# Patient Record
Sex: Female | Born: 2000 | Race: Black or African American | Hispanic: No | Marital: Single | Smoking: Never smoker
Health system: Southern US, Community
[De-identification: ages and names within clinical notes are randomized; demographics above are authoritative.]

## PROBLEM LIST (undated history)

## (undated) DIAGNOSIS — J45909 Unspecified asthma, uncomplicated: Secondary | ICD-10-CM

---
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
## 2022-02-13 ENCOUNTER — Ambulatory Visit
Admission: RE | Admit: 2022-02-13 | Discharge: 2022-02-13 | Disposition: A | Discharge status: Home / Self Care | Payer: Medicaid Other | Source: Ambulatory Visit | Attending: Physician Assistant | Admitting: Physician Assistant

## 2022-02-13 VITALS — BP 113/73 | HR 95 | Temp 98.7°F | Resp 16

## 2022-02-13 DIAGNOSIS — N760 Acute vaginitis: Secondary | ICD-10-CM | POA: Diagnosis not present

## 2022-02-13 DIAGNOSIS — Z113 Encounter for screening for infections with a predominantly sexual mode of transmission: Secondary | ICD-10-CM | POA: Diagnosis not present

## 2022-02-13 NOTE — ED Triage Notes (Signed)
Pt c/o vaginal irritation onset ~ 1 week ago. Requesting sti screening.

## 2022-02-13 NOTE — Discharge Instructions (Addendum)
Testing will be completed in 48 hours.  If you do not get a call from this office that indicates the test is negative.  Log onto MyChart to review the results when it post in 48 hours.  Advised follow-up PCP or return to urgent care as needed.

## 2022-02-13 NOTE — ED Provider Notes (Signed)
[facility]    CSN: [id number] Arrival date & time: 02/13/22  1154      History   Chief Complaint Chief Complaint  Patient presents with   SEXUALLY TRANSMITTED DISEASE    Entered by patient    HPI Charlotte Acosta is a 21 y.o. female.   21 year old female presents for STI screening.  Patient indicates the last time she had intercourse was 2 months ago and it was unprotected.  Patient indicates her last menses was 1 week ago and it was normal.  She indicates that for the past several days she has been having some external vaginal irritation.  She denies any vaginal discharge or odor.  She denies any urinary symptoms such as frequency urgency or dysuria.  Patient today is requesting to have STI screening.  She denies any back pain fever or chills.     Past Medical History:  Diagnosis Date   Asthma     There are no problems to display for this patient.   History reviewed. No pertinent surgical history.  OB History   No obstetric history on file.      Home Medications    Prior to Admission medications   Medication Sig Start Date End Date Taking? Authorizing Provider  etonogestrel (NEXPLANON) 68 MG IMPL implant  05/11/20  Yes [provider]  albuterol (PROVENTIL HFA;VENTOLIN HFA) 108 (90 BASE) MCG/ACT inhaler Inhale 2 puffs into the lungs every 6 (six) hours as needed. For wheezing/shortness of breath    [provider]  amoxicillin (AMOXIL) 400 MG/5ML suspension 10 mls po bid x 10 days 12/26/11   Viviano Simas, NP  loratadine (CLARITIN) 5 MG chewable tablet Chew 5 mg by mouth daily.    [provider]  metroNIDAZOLE (FLAGYL) 500 MG tablet Take 1 tablet (500 mg total) by mouth 2 (two) times daily. 09/19/21   Lamptey, Britta Mccreedy, MD    Family History Family History  Problem Relation Age of Onset   Healthy Mother    Healthy Father     Social History Social History   Tobacco Use   Smoking status: Never   Smokeless tobacco:  Never  Substance Use Topics   Alcohol use: Never   Drug use: Never     Allergies   Patient has no known allergies.   Review of Systems Review of Systems   Physical Exam Triage Vital Signs ED Triage Vitals  Enc Vitals Group     BP 02/13/22 1215 113/73     Pulse Rate 02/13/22 1215 95     Resp 02/13/22 1215 16     Temp 02/13/22 1215 98.7 F (37.1 C)     Temp Source 02/13/22 1215 Oral     SpO2 02/13/22 1215 98 %     Weight --      Height --      Head Circumference --      Peak Flow --      Pain Score 02/13/22 1216 0     Pain Loc --      Pain Edu? --      Excl. in GC? --    No data found.  Updated Vital Signs BP 113/73 (BP Location: Left Arm)   Pulse 95   Temp 98.7 F (37.1 C) (Oral)   Resp 16   SpO2 98%   Visual Acuity Right Eye Distance:   Left Eye Distance:   Bilateral Distance:    Right Eye Near:   Left Eye Near:  Bilateral Near:     Physical Exam Constitutional:      Appearance: Normal appearance.  Neurological:     Mental Status: She is alert.      UC Treatments / Results  Labs (all labs ordered are listed, but only abnormal results are displayed) Labs Reviewed  CERVICOVAGINAL ANCILLARY ONLY    EKG   Radiology No results found.  Procedures Procedures (including critical care time)  Medications Ordered in UC Medications - No data to display  Initial Impression / Assessment and Plan / UC Course  I have reviewed the triage vital signs and the nursing notes.  Pertinent labs & imaging results that were available during my care of the patient were reviewed by me and considered in my medical decision making (see chart for details).    Plan: 1.  The vaginitis will be treated with the following: A.  Treatment will be determined with the results of the STI screening. 2.  Screening for STI will be treated with the following: A.  Treatment will be initiated depending on the results of the STI screening. 3.  Advised follow-up PCP or  return to urgent care as needed. Final Clinical Impressions(s) / UC Diagnoses   Final diagnoses:  Vaginitis and vulvovaginitis  Routine screening for STI (sexually transmitted infection)     Discharge Instructions      Testing will be completed in 48 hours.  If you do not get a call from this office that indicates the test is negative.  Log onto MyChart to review the results when it post in 48 hours.  Advised follow-up PCP or return to urgent care as needed.    ED Prescriptions   None    PDMP not reviewed this encounter.   Nyoka Lint, PA-C 02/13/22 1231

## 2022-02-14 LAB — CERVICOVAGINAL ANCILLARY ONLY
Bacterial Vaginitis (gardnerella): POSITIVE — AB
Candida Glabrata: NEGATIVE
Candida Vaginitis: NEGATIVE
Chlamydia: POSITIVE — AB
Comment: NEGATIVE
Comment: NEGATIVE
Comment: NEGATIVE
Comment: NEGATIVE
Comment: NEGATIVE
Comment: NORMAL
Neisseria Gonorrhea: NEGATIVE
Trichomonas: NEGATIVE

## 2022-02-15 ENCOUNTER — Telehealth (HOSPITAL_COMMUNITY): Payer: Self-pay | Admitting: Emergency Medicine

## 2022-02-15 MED ORDER — METRONIDAZOLE 0.75 % VA GEL: 1.0000 | Freq: Every day | VAGINAL | 0 refills | Status: AC

## 2022-02-15 MED ORDER — DOXYCYCLINE HYCLATE 100 MG PO CAPS: 100.0000 mg | ORAL_CAPSULE | Freq: Two times a day (BID) | ORAL | 0 refills | Status: AC

## 2022-05-09 ENCOUNTER — Ambulatory Visit
Admission: RE | Admit: 2022-05-09 | Discharge: 2022-05-09 | Disposition: A | Discharge status: Home / Self Care | Payer: Medicaid Other | Source: Ambulatory Visit | Attending: Family Medicine | Admitting: Family Medicine

## 2022-05-09 ENCOUNTER — Other Ambulatory Visit: Payer: Self-pay

## 2022-05-09 VITALS — BP 138/63 | HR 94 | Temp 98.4°F | Resp 18

## 2022-05-09 DIAGNOSIS — T300 Burn of unspecified body region, unspecified degree: Secondary | ICD-10-CM | POA: Diagnosis not present

## 2022-05-09 DIAGNOSIS — N760 Acute vaginitis: Secondary | ICD-10-CM
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
## 2022-05-09 NOTE — Discharge Instructions (Signed)
Staff will notify you if there is anything positive on the swab  Continue caring for the the wounds as you have been doing: Wash with soapy water or with hydrogen peroxide and then put new Neosporin on.  Keeping a bandage in place can keep your off from being irritating.  It looks like it is healing well

## 2022-05-10 LAB — CERVICOVAGINAL ANCILLARY ONLY
Bacterial Vaginitis (gardnerella): POSITIVE — AB
Candida Glabrata: NEGATIVE
Candida Vaginitis: NEGATIVE
Chlamydia: NEGATIVE
Comment: NEGATIVE
Comment: NEGATIVE
Comment: NEGATIVE
Comment: NEGATIVE
Comment: NEGATIVE
Comment: NORMAL
Neisseria Gonorrhea: NEGATIVE
Trichomonas: NEGATIVE

## 2022-05-11 ENCOUNTER — Telehealth (HOSPITAL_COMMUNITY): Payer: Self-pay | Admitting: Emergency Medicine

## 2022-05-11 MED ORDER — METRONIDAZOLE 0.75 % VA GEL: 1.0000 | Freq: Every day | VAGINAL | 0 refills | Status: AC

## 2022-05-11 MED ORDER — METRONIDAZOLE 500 MG PO TABS: 500.0000 mg | ORAL_TABLET | Freq: Two times a day (BID) | ORAL | 0 refills | Status: DC

## 2022-05-11 NOTE — Telephone Encounter (Signed)
Patient preferred metrogel

## 2022-09-19 ENCOUNTER — Ambulatory Visit
Admission: RE | Admit: 2022-09-19 | Discharge: 2022-09-19 | Disposition: A | Discharge status: Home / Self Care | Payer: Medicaid Other | Source: Ambulatory Visit | Attending: Family Medicine | Admitting: Family Medicine

## 2022-09-19 ENCOUNTER — Other Ambulatory Visit: Payer: Self-pay

## 2022-09-19 VITALS — BP 125/74 | HR 67 | Temp 98.8°F | Resp 18

## 2022-09-19 DIAGNOSIS — Z113 Encounter for screening for infections with a predominantly sexual mode of transmission: Secondary | ICD-10-CM | POA: Diagnosis not present

## 2022-09-19 NOTE — ED Triage Notes (Signed)
Pt here for std testing; pt sts hx of BV in past

## 2022-09-19 NOTE — ED Provider Notes (Signed)
[facility]    CSN: [id number] Arrival date & time: 09/19/22  1252      History   Chief Complaint Chief Complaint  Patient presents with   SEXUALLY TRANSMITTED DISEASE    Routine check up - Entered by patient    HPI Charlotte Acosta is a 22 y.o. female.   HPI Patient here today for routine STD screening.  Currently asymptomatic.  Would like HIV and RPR testing.  Denies any concerns of pregnancy. Patient is currently sexually active and does not routinely use barrier protection.  She is concerned that she my have BV as she routinely gets BV following  her menstrual cycles  Past Medical History:  Diagnosis Date   Asthma     There are no problems to display for this patient.   History reviewed. No pertinent surgical history.  OB History   No obstetric history on file.      Home Medications    Prior to Admission medications   Medication Sig Start Date End Date Taking? Authorizing Provider  albuterol (PROVENTIL HFA;VENTOLIN HFA) 108 (90 BASE) MCG/ACT inhaler Inhale 2 puffs into the lungs every 6 (six) hours as needed. For wheezing/shortness of breath    [provider]  etonogestrel (NEXPLANON) 68 MG IMPL implant  05/11/20   [provider]  loratadine (CLARITIN) 5 MG chewable tablet Chew 5 mg by mouth daily.    [provider]  metroNIDAZOLE (FLAGYL) 500 MG tablet Take 1 tablet (500 mg total) by mouth 2 (two) times daily. Patient not taking: Reported on 09/19/2022 05/11/22   Lamptey, Britta Mccreedy, MD    Family History Family History  Problem Relation Age of Onset   Healthy Mother    Healthy Father     Social History Social History   Tobacco Use   Smoking status: Never   Smokeless tobacco: Never  Substance Use Topics   Alcohol use: Never   Drug use: Never     Allergies   Patient has no known allergies.   Review of Systems Review of Systems Pertinent negatives listed in HPI  Physical Exam Triage Vital Signs ED  Triage Vitals  Encounter Vitals Group     BP 09/19/22 1343 125/74     Systolic BP Percentile --      Diastolic BP Percentile --      Pulse Rate 09/19/22 1343 67     Resp 09/19/22 1343 18     Temp 09/19/22 1343 98.8 F (37.1 C)     Temp Source 09/19/22 1343 Oral     SpO2 09/19/22 1343 98 %     Weight --      Height --      Head Circumference --      Peak Flow --      Pain Score 09/19/22 1344 0     Pain Loc --      Pain Education --      Exclude from Growth Chart --    No data found.  Updated Vital Signs BP 125/74 (BP Location: Left Arm)   Pulse 67   Temp 98.8 F (37.1 C) (Oral)   Resp 18   SpO2 98%   Visual Acuity Right Eye Distance:   Left Eye Distance:   Bilateral Distance:    Right Eye Near:   Left Eye Near:    Bilateral Near:     Physical Exam General appearance: Alert, well developed, well nourished, cooperative  Head: Normocephalic, without obvious abnormality, atraumatic Heart: rate  and rhythm normal.  Respiratory: Respirations even and unlabored, normal respiratory rate Extremities: No gross deformities Skin: Skin color, texture, turgor normal. No rashes seen  Psych: Appropriate mood and affect. UC Treatments / Results  Labs (all labs ordered are listed, but only abnormal results are displayed) Labs Reviewed  HIV ANTIBODY (ROUTINE TESTING W REFLEX)  RPR  CERVICOVAGINAL ANCILLARY ONLY    EKG   Radiology No results found.  Procedures Procedures (including critical care time)  Medications Ordered in UC Medications - No data to display  Initial Impression / Assessment and Plan / UC Course  I have reviewed the triage vital signs and the nursing notes.  Pertinent labs & imaging results that were available during my care of the patient were reviewed by me and considered in my medical decision making (see chart for details).    Screen STD, asymptomatic. HIV/RPR pending and vaginal cytology was self collected. Patient  patient aware that our  office will only contact her if test results are abnormal and require treatment. Final Clinical Impressions(s) / UC Diagnoses   Final diagnoses:  Screen for STD (sexually transmitted disease)     Discharge Instructions      Your lab results will be available within 24 hours, our office will only contact you if your labs result and indicate the need for treatment.     ED Prescriptions   None    PDMP not reviewed this encounter.   Bing Neighbors, NP 09/19/22 [phone]

## 2022-09-19 NOTE — Discharge Instructions (Addendum)
Your lab results will be available within 24 hours, our office will only contact you if your labs result and indicate the need for treatment.

## 2022-09-20 LAB — CERVICOVAGINAL ANCILLARY ONLY
Bacterial Vaginitis (gardnerella): POSITIVE — AB
Candida Glabrata: NEGATIVE
Candida Vaginitis: NEGATIVE
Chlamydia: NEGATIVE
Comment: NEGATIVE
Comment: NEGATIVE
Comment: NEGATIVE
Comment: NEGATIVE
Comment: NEGATIVE
Comment: NORMAL
Neisseria Gonorrhea: NEGATIVE
Trichomonas: NEGATIVE

## 2022-09-20 LAB — RPR: RPR Ser Ql: NONREACTIVE

## 2022-09-20 LAB — HIV ANTIBODY (ROUTINE TESTING W REFLEX): HIV Screen 4th Generation wRfx: NONREACTIVE

## 2022-09-21 ENCOUNTER — Telehealth: Payer: Self-pay | Admitting: Emergency Medicine

## 2022-09-21 MED ORDER — METRONIDAZOLE 500 MG PO TABS: 500.0000 mg | ORAL_TABLET | Freq: Two times a day (BID) | ORAL | 0 refills | Status: DC

## 2022-10-10 ENCOUNTER — Ambulatory Visit (HOSPITAL_COMMUNITY): Payer: Medicaid Other

## 2022-10-11 ENCOUNTER — Ambulatory Visit
Admission: RE | Admit: 2022-10-11 | Discharge: 2022-10-11 | Disposition: A | Discharge status: Home / Self Care | Payer: Medicaid Other | Source: Ambulatory Visit

## 2022-10-11 VITALS — BP 137/89 | HR 92 | Temp 98.1°F | Resp 18

## 2022-10-11 DIAGNOSIS — L501 Idiopathic urticaria: Secondary | ICD-10-CM

## 2022-10-11 MED ORDER — CETIRIZINE HCL 10 MG PO TABS: 10.0000 mg | ORAL_TABLET | Freq: Every day | ORAL | 0 refills | Status: AC

## 2022-10-11 NOTE — ED Provider Notes (Signed)
[facility]    CSN: [id number] Arrival date & time: 10/11/22  0825      History   Chief Complaint Chief Complaint  Patient presents with   Allergic Reaction    Entered by patient   Appointment    1100    HPI Marye Dicostanzo is a 22 y.o. female.   HPI Patient with a past history of asthma presents today with a month history of generalized hives going and coming.  Patient has not recent made any changes in diet, detergents, or any recent new exposures to outdoor irritants.  Patient has a distant history of asthma for no known history of eczema.  Past Medical History:  Diagnosis Date   Asthma     There are no problems to display for this patient.   History reviewed. No pertinent surgical history.  OB History   No obstetric history on file.      Home Medications    Prior to Admission medications   Medication Sig Start Date End Date Taking? Authorizing Provider  cetirizine (ZYRTEC) 10 MG tablet Take 1 tablet (10 mg total) by mouth daily. 10/11/22  Yes Bing Neighbors, NP  diphenhydrAMINE (BENADRYL) 50 MG tablet Take 50 mg by mouth at bedtime as needed for itching.   Yes [provider]  albuterol (PROVENTIL HFA;VENTOLIN HFA) 108 (90 BASE) MCG/ACT inhaler Inhale 2 puffs into the lungs every 6 (six) hours as needed. For wheezing/shortness of breath    [provider]  etonogestrel (NEXPLANON) 68 MG IMPL implant  05/11/20   [provider]  metroNIDAZOLE (FLAGYL) 500 MG tablet Take 1 tablet (500 mg total) by mouth 2 (two) times daily. 09/21/22   Lamptey, Britta Mccreedy, MD    Family History Family History  Problem Relation Age of Onset   Healthy Mother    Healthy Father     Social History Social History   Tobacco Use   Smoking status: Never   Smokeless tobacco: Never  Substance Use Topics   Alcohol use: Never   Drug use: Never     Allergies   Patient has no known allergies.   Review of Systems Review of  Systems Pertinent negatives listed in HPI   Physical Exam Triage Vital Signs ED Triage Vitals  Encounter Vitals Group     BP 10/11/22 0952 137/89     Systolic BP Percentile --      Diastolic BP Percentile --      Pulse Rate 10/11/22 0952 92     Resp 10/11/22 0952 18     Temp 10/11/22 0952 98.1 F (36.7 C)     Temp Source 10/11/22 0952 Oral     SpO2 10/11/22 0952 99 %     Weight --      Height --      Head Circumference --      Peak Flow --      Pain Score 10/11/22 0956 0     Pain Loc --      Pain Education --      Exclude from Growth Chart --    No data found.  Updated Vital Signs BP 137/89 (BP Location: Left Arm)   Pulse 92   Temp 98.1 F (36.7 C) (Oral)   Resp 18   SpO2 99%   Visual Acuity Right Eye Distance:   Left Eye Distance:   Bilateral Distance:    Right Eye Near:   Left Eye Near:    Bilateral Near:  Physical Exam Vitals and nursing note reviewed.  Constitutional:      Appearance: Normal appearance.  HENT:     Head: Normocephalic and atraumatic.  Eyes:     Extraocular Movements: Extraocular movements intact.     Conjunctiva/sclera: Conjunctivae normal.     Pupils: Pupils are equal, round, and reactive to light.  Cardiovascular:     Rate and Rhythm: Normal rate and regular rhythm.  Pulmonary:     Effort: Pulmonary effort is normal.     Breath sounds: Normal breath sounds.  Musculoskeletal:     Cervical back: Normal range of motion and neck supple.  Skin:    General: Skin is warm and dry.     Findings: Rash present. Rash is urticarial.  Neurological:     General: No focal deficit present.     Mental Status: She is alert.      UC Treatments / Results  Labs (all labs ordered are listed, but only abnormal results are displayed) Labs Reviewed - No data to display  EKG   Radiology No results found.  Procedures Procedures (including critical care time)  Medications Ordered in UC Medications - No data to display  Initial  Impression / Assessment and Plan / UC Course  I have reviewed the triage vital signs and the nursing notes.  Pertinent labs & imaging results that were available during my care of the patient were reviewed by me and considered in my medical decision making (see chart for details).    Acute hives of unknown etiology, encouraged daily cetirizine to prevent recurrent hives and treat itching.  Encouraged patient to follow-up with allergist to obtain allergy testing to determine the source of hive eruptions. Return precautions given. Final Clinical Impressions(s) / UC Diagnoses   Final diagnoses:  Acute idiopathic urticaria     Discharge Instructions      You must take daily to prevent recurrent hive eruptions.  Encouraged that you schedule an up appointment with the allergist to have testing completed in order to find out what is causing your skin reaction.      ED Prescriptions     Medication Sig Dispense Auth. Provider   cetirizine (ZYRTEC) 10 MG tablet Take 1 tablet (10 mg total) by mouth daily. 90 tablet Bing Neighbors, NP      PDMP not reviewed this encounter.   Bing Neighbors, NP 10/17/22 [phone]

## 2022-10-11 NOTE — Discharge Instructions (Signed)
You must take daily to prevent recurrent hive eruptions.  Encouraged that you schedule an up appointment with the allergist to have testing completed in order to find out what is causing your skin reaction.

## 2022-10-11 NOTE — ED Triage Notes (Signed)
Pt reports hives all over the body on and off x 1 month. Benadryl gives relief.

## 2022-11-21 ENCOUNTER — Ambulatory Visit
Admission: EM | Admit: 2022-11-21 | Discharge: 2022-11-21 | Disposition: A | Discharge status: Home / Self Care | Payer: Medicaid Other

## 2022-11-21 ENCOUNTER — Ambulatory Visit: Payer: Medicaid Other

## 2022-11-21 DIAGNOSIS — M25571 Pain in right ankle and joints of right foot: Secondary | ICD-10-CM

## 2022-11-21 DIAGNOSIS — S99911A Unspecified injury of right ankle, initial encounter: Secondary | ICD-10-CM | POA: Diagnosis not present
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
## 2022-11-27 ENCOUNTER — Encounter: Payer: Self-pay | Admitting: Physician Assistant

## 2022-12-06 ENCOUNTER — Ambulatory Visit: Payer: Medicaid Other

## 2022-12-06 ENCOUNTER — Ambulatory Visit
Admission: RE | Admit: 2022-12-06 | Discharge: 2022-12-06 | Disposition: A | Discharge status: Home / Self Care | Payer: Medicaid Other | Source: Ambulatory Visit | Attending: Internal Medicine | Admitting: Internal Medicine

## 2022-12-06 ENCOUNTER — Telehealth: Payer: Self-pay | Admitting: Internal Medicine

## 2022-12-06 VITALS — BP 108/60 | HR 87 | Temp 99.1°F | Resp 16 | Ht 61.0 in | Wt 245.0 lb

## 2022-12-06 DIAGNOSIS — R140 Abdominal distension (gaseous): Secondary | ICD-10-CM

## 2022-12-06 DIAGNOSIS — Z3202 Encounter for pregnancy test, result negative: Secondary | ICD-10-CM

## 2022-12-06 DIAGNOSIS — R1013 Epigastric pain: Secondary | ICD-10-CM

## 2022-12-06 LAB — POCT URINALYSIS DIP (MANUAL ENTRY)
Bilirubin, UA: NEGATIVE
Blood, UA: NEGATIVE
Glucose, UA: NEGATIVE mg/dL
Ketones, POC UA: NEGATIVE mg/dL
Leukocytes, UA: NEGATIVE
Nitrite, UA: NEGATIVE
Protein Ur, POC: NEGATIVE mg/dL
Spec Grav, UA: 1.025 (ref 1.010–1.025)
Urobilinogen, UA: 1 U/dL
pH, UA: 6 (ref 5.0–8.0)

## 2022-12-06 LAB — POCT URINE PREGNANCY: Preg Test, Ur: NEGATIVE

## 2022-12-06 MED ORDER — SUCRALFATE 1 G PO TABS: 1.0000 g | ORAL_TABLET | Freq: Two times a day (BID) | ORAL | 0 refills | Status: DC

## 2022-12-06 MED ORDER — LIDOCAINE VISCOUS HCL 2 % MT SOLN
15.0000 mL | Freq: Once | OROMUCOSAL | Status: AC
  Administered 2022-12-06: 15 mL via OROMUCOSAL

## 2022-12-06 MED ORDER — FAMOTIDINE 20 MG PO TABS: 20.0000 mg | ORAL_TABLET | Freq: Every day | ORAL | 0 refills | Status: DC

## 2022-12-06 MED ORDER — ALUMINUM & MAGNESIUM HYDROXIDE 200-200 MG/5ML PO SUSP
30.0000 mL | Freq: Once | ORAL | Status: AC
  Administered 2022-12-06: 30 mL via ORAL

## 2022-12-06 NOTE — ED Triage Notes (Addendum)
Patient here today with c/o constipation X 1 week. She states that she feels bloated. She tried taking Prilosec with no relief. She has also been trying to eat fiber enriched fruits no relief. She has had a loss of appetite.  Patient also states that her LMP was on 11/14/2022. She states that she normally gets a period every 2 weeks and was supposed to get it on 11/30/2022. She has the Nexplanon

## 2022-12-06 NOTE — Telephone Encounter (Signed)
Patient called back, and I discussed x-ray results with her.  No change to treatment plan at this time.  Discussed that stool burden is most likely not contributing to pain, although MiraLAX may be helpful if she does not have a bowel movement in the next day or two.  She verbalized understanding and was agreeable with plan.

## 2022-12-06 NOTE — ED Provider Notes (Signed)
[facility]    CSN: [id number] Arrival date & time: 12/06/22  1423      History   Chief Complaint Chief Complaint  Patient presents with   Constipation    Entered by patient    HPI Charlotte Acosta is a 22 y.o. female.   Patient presents with abdominal discomfort that started about 7 to 9 days ago.  Reports that it feels like "bloating" in her upper abdomen.  Denies any significant pain.  Denies nausea, vomiting, diarrhea.  Patient reports that she was originally concerned about constipation given that her bowel movements have not been baseline.  She typically has daily bowel movements but has been having approximately every other day bowel movements.  Denies blood in stool and stool is formed.  Denies dysuria, urinary frequency, vaginal discharge.  Denies any concern for STD.  She reports that she has had symptoms similar to this approximately a year ago and was evaluated by different provider.  Reports that they prescribed her Prilosec at that time with resolution of symptoms.  She states that she took this Prilosec approximately 3 days in a row for these current symptoms but it was not helpful.  She is still passing flatulence.  Patient also reports that she has not had a menstrual cycle since 11/14/2022.  She states that ever since getting Nexplanon placed approximately 2 years ago, she has been having a menstrual cycle every 2 weeks.  She has not discussed this with gynecologist who placed the Nexplanon.   Constipation   Past Medical History:  Diagnosis Date   Asthma     Patient Active Problem List   Diagnosis Date Noted   Acute kidney injury (HCC) 07/19/2020   Other specified abnormal findings of blood chemistry 07/14/2020   Allergic contact dermatitis due to other agents 12/01/2011   Abnormal weight gain 12/01/2011   Acanthosis nigricans 11/23/2011   Allergic rhinitis 11/23/2011   Asthma, intermittent 11/23/2011   Obesity, morbid (HCC) 11/23/2011    History  reviewed. No pertinent surgical history.  OB History   No obstetric history on file.      Home Medications    Prior to Admission medications   Medication Sig Start Date End Date Taking? Authorizing Provider  famotidine (PEPCID) 20 MG tablet Take 1 tablet (20 mg total) by mouth at bedtime. 12/06/22  Yes Blease Capaldi, Rolly Salter E, FNP  omeprazole (PRILOSEC) 20 MG capsule Take 20 mg by mouth daily. 12/08/21  Yes [provider]  sucralfate (CARAFATE) 1 g tablet Take 1 tablet (1 g total) by mouth 2 (two) times daily. 12/06/22  Yes Hughie Melroy, Rolly Salter E, FNP  albuterol (PROVENTIL HFA;VENTOLIN HFA) 108 (90 BASE) MCG/ACT inhaler Inhale 2 puffs into the lungs every 6 (six) hours as needed. For wheezing/shortness of breath    [provider]  cetirizine (ZYRTEC) 10 MG tablet Take 1 tablet (10 mg total) by mouth daily. 10/11/22   Bing Neighbors, NP  diphenhydrAMINE (BENADRYL) 25 MG tablet Take 25 mg by mouth at bedtime as needed for itching. 07/14/20   [provider]  diphenhydrAMINE (BENADRYL) 50 MG tablet Take 50 mg by mouth at bedtime as needed for itching.    [provider]  docusate sodium (COLACE) 50 MG capsule Take by mouth. 12/08/21   [provider]  Ergocalciferol (VITAMIN D2 PO) Take by mouth. 11/03/21   [provider]  etonogestrel (NEXPLANON) 68 MG IMPL implant  05/11/20   [provider]  metroNIDAZOLE (FLAGYL) 500 MG tablet  Take 1 tablet (500 mg total) by mouth 2 (two) times daily. 09/21/22   Lamptey, Britta Mccreedy, MD    Family History Family History  Problem Relation Age of Onset   Healthy Mother    Healthy Father     Social History Social History   Tobacco Use   Smoking status: Never   Smokeless tobacco: Never  Vaping Use   Vaping status: Never Used  Substance Use Topics   Alcohol use: Never   Drug use: Never     Allergies   Patient has no known allergies.   Review of Systems Review of Systems Per HPI  Physical  Exam Triage Vital Signs ED Triage Vitals  Encounter Vitals Group     BP 12/06/22 1522 108/60     Systolic BP Percentile --      Diastolic BP Percentile --      Pulse Rate 12/06/22 1522 87     Resp 12/06/22 1522 16     Temp 12/06/22 1522 99.1 F (37.3 C)     Temp Source 12/06/22 1522 Oral     SpO2 12/06/22 1522 98 %     Weight 12/06/22 1521 245 lb (111.1 kg)     Height 12/06/22 1521 5\' 1"  (1.549 m)     Head Circumference --      Peak Flow --      Pain Score 12/06/22 1521 0     Pain Loc --      Pain Education --      Exclude from Growth Chart --    No data found.  Updated Vital Signs BP 108/60 (BP Location: Left Arm)   Pulse 87   Temp 99.1 F (37.3 C) (Oral)   Resp 16   Ht 5\' 1"  (1.549 m)   Wt 245 lb (111.1 kg)   LMP 11/14/2022 (Exact Date)   SpO2 98%   BMI 46.29 kg/m   Visual Acuity Right Eye Distance:   Left Eye Distance:   Bilateral Distance:    Right Eye Near:   Left Eye Near:    Bilateral Near:     Physical Exam Constitutional:      General: She is not in acute distress.    Appearance: Normal appearance. She is not toxic-appearing or diaphoretic.  HENT:     Head: Normocephalic and atraumatic.  Eyes:     Extraocular Movements: Extraocular movements intact.     Conjunctiva/sclera: Conjunctivae normal.  Cardiovascular:     Rate and Rhythm: Normal rate and regular rhythm.     Pulses: Normal pulses.     Heart sounds: Normal heart sounds.  Pulmonary:     Effort: Pulmonary effort is normal. No respiratory distress.     Breath sounds: Normal breath sounds.  Abdominal:     General: Bowel sounds are normal. There is no distension.     Palpations: Abdomen is soft.     Tenderness: There is abdominal tenderness.     Comments: Mild tenderness to palpation to epigastric area.   Neurological:     General: No focal deficit present.     Mental Status: She is alert and oriented to person, place, and time. Mental status is at baseline.  Psychiatric:        Mood  and Affect: Mood normal.        Behavior: Behavior normal.        Thought Content: Thought content normal.        Judgment: Judgment normal.  UC Treatments / Results  Labs (all labs ordered are listed, but only abnormal results are displayed) Labs Reviewed  POCT URINALYSIS DIP (MANUAL ENTRY) - Abnormal; Notable for the following components:      Result Value   Clarity, UA hazy (*)    All other components within normal limits  CBC  COMPREHENSIVE METABOLIC PANEL  POCT URINE PREGNANCY    EKG   Radiology DG Abd 2 Views  Result Date: 12/06/2022 CLINICAL DATA:  Abdominal pain. EXAM: ABDOMEN - 2 VIEW COMPARISON:  None Available. FINDINGS: Normal bowel gas pattern. No bowel dilatation or evidence of obstruction. Small to moderate colonic stool burden. No abnormal rectal distention. No radiopaque calculi or abnormal soft tissue calcifications. Included lung bases are clear. No osseous abnormalities are seen. IMPRESSION: Normal bowel gas pattern. Small to moderate colonic stool burden. Electronically Signed   By: Narda Rutherford M.D.   On: 12/06/2022 18:31    Procedures Procedures (including critical care time)  Medications Ordered in UC Medications  aluminum-magnesium hydroxide 200-200 MG/5ML suspension 30 mL (30 mLs Oral Given 12/06/22 1607)  lidocaine (XYLOCAINE) 2 % viscous mouth solution 15 mL (15 mLs Mouth/Throat Given 12/06/22 1607)    Initial Impression / Assessment and Plan / UC Course  I have reviewed the triage vital signs and the nursing notes.  Pertinent labs & imaging results that were available during my care of the patient were reviewed by me and considered in my medical decision making (see chart for details).     I am most suspicious that patient is having epigastric discomfort and bloating due to GERD versus ulcer.  Pregnancy test was negative.  UA unremarkable.  GI cocktail administered in urgent care with minimal improvement in symptoms.  Although, there is  no significant signs of acute abdomen or dehydration on exam that would warrant more advanced imaging or emergent evaluation at this time.  Abdominal x-ray negative for any acute abnormality. Mild stool burden noted but no significant constipation or bowel obstruction. Attempted to call patient to notify of results but there was no answer. LVM for a call back per privacy policy.  Patient advised to continue Prilosec prior to discharge and x-ray results.  Will add Pepcid and Carafate.  Encouraged patient of strict ER precautions if symptoms persist or worsen.  Also advised follow-up at provided contact information for gastroenterology given that this is a recurrent issue for patient.  Urine pregnancy test was negative but patient reports irregular menstrual cycles since Nexplanon placement 2 years ago so encouraged follow-up with established gynecologist for further evaluation and management of this as well.  Patient verbalized understanding and was agreeable with plan. Final Clinical Impressions(s) / UC Diagnoses   Final diagnoses:  Abdominal pain, epigastric  Bloating  Urine pregnancy test negative     Discharge Instructions      I will call if x-ray results are abnormal.  Continue Prilosec.  I have added two other medications including Pepcid that you will take daily at bedtime and Carafate that you will take twice daily.  Please go to the emergency department if symptoms persist or worsen.  Blood work is pending.  Follow-up with gastrointestinal and gynecology specialist as well.     ED Prescriptions     Medication Sig Dispense Auth. Provider   famotidine (PEPCID) 20 MG tablet Take 1 tablet (20 mg total) by mouth at bedtime. 30 tablet [place]ipp[place], Rehobeth E, [place]   sucralfate (CARAFATE) 1 g tablet Take 1 tablet (1 g total)  by mouth 2 (two) times daily. 60 tablet [place], Acie Fredrickson, [place]      PDMP not reviewed this encounter.   Gustavus Bryant, [place] 12/06/22 [phone]

## 2022-12-06 NOTE — Discharge Instructions (Signed)
I will call if x-ray results are abnormal.  Continue Prilosec.  I have added two other medications including Pepcid that you will take daily at bedtime and Carafate that you will take twice daily.  Please go to the emergency department if symptoms persist or worsen.  Blood work is pending.  Follow-up with gastrointestinal and gynecology specialist as well.

## 2022-12-07 LAB — COMPREHENSIVE METABOLIC PANEL
ALT: 15 [IU]/L (ref 0–32)
AST: 23 [IU]/L (ref 0–40)
Albumin: 4.4 g/dL (ref 4.0–5.0)
Alkaline Phosphatase: 73 [IU]/L (ref 42–106)
BUN/Creatinine Ratio: 10 (ref 9–23)
BUN: 10 mg/dL (ref 6–20)
Bilirubin Total: 0.3 mg/dL (ref 0.0–1.2)
CO2: 21 mmol/L (ref 20–29)
Calcium: 9.3 mg/dL (ref 8.7–10.2)
Chloride: 102 mmol/L (ref 96–106)
Creatinine, Ser: 0.98 mg/dL (ref 0.57–1.00)
Globulin, Total: 2.8 g/dL (ref 1.5–4.5)
Glucose: 77 mg/dL (ref 70–99)
Potassium: 4.6 mmol/L (ref 3.5–5.2)
Sodium: 140 mmol/L (ref 134–144)
Total Protein: 7.2 g/dL (ref 6.0–8.5)
eGFR: 85 mL/min/{1.73_m2} (ref >59)

## 2022-12-07 LAB — CBC
Hematocrit: 42.2 % (ref 34.0–46.6)
Hemoglobin: 13.3 g/dL (ref 11.1–15.9)
MCH: 26.6 pg (ref 26.6–33.0)
MCHC: 31.5 g/dL (ref 31.5–35.7)
MCV: 84 fL (ref 79–97)
Platelets: 231 10*3/uL (ref 150–450)
RBC: 5 x10E6/uL (ref 3.77–5.28)
RDW: 12.7 % (ref 11.7–15.4)
WBC: 7.9 10*3/uL (ref 3.4–10.8)

## 2022-12-19 ENCOUNTER — Other Ambulatory Visit: Payer: Self-pay

## 2022-12-19 ENCOUNTER — Ambulatory Visit
Admission: RE | Admit: 2022-12-19 | Discharge: 2022-12-19 | Disposition: A | Discharge status: Home / Self Care | Payer: Medicaid Other | Source: Ambulatory Visit | Attending: Pediatrics | Admitting: Pediatrics

## 2022-12-19 VITALS — BP 128/64 | HR 76 | Temp 98.4°F | Resp 18

## 2022-12-19 DIAGNOSIS — S31109A Unspecified open wound of abdominal wall, unspecified quadrant without penetration into peritoneal cavity, initial encounter: Secondary | ICD-10-CM | POA: Diagnosis not present
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
## 2023-03-06 ENCOUNTER — Ambulatory Visit: Payer: Self-pay | Admitting: Podiatry

## 2023-03-22 DIAGNOSIS — G43009 Migraine without aura, not intractable, without status migrainosus: Secondary | ICD-10-CM | POA: Insufficient documentation

## 2023-04-26 DIAGNOSIS — G935 Compression of brain: Secondary | ICD-10-CM | POA: Insufficient documentation

## 2023-06-01 ENCOUNTER — Ambulatory Visit
Admission: RE | Admit: 2023-06-01 | Discharge: 2023-06-01 | Disposition: A | Discharge status: Home / Self Care | Source: Ambulatory Visit | Attending: Family Medicine | Admitting: Family Medicine

## 2023-06-01 ENCOUNTER — Other Ambulatory Visit: Payer: Self-pay

## 2023-06-01 VITALS — BP 147/84 | HR 73 | Temp 98.7°F | Resp 18

## 2023-06-01 DIAGNOSIS — Z113 Encounter for screening for infections with a predominantly sexual mode of transmission: Secondary | ICD-10-CM

## 2023-06-01 LAB — POCT URINE PREGNANCY: Preg Test, Ur: NEGATIVE

## 2023-06-01 NOTE — Discharge Instructions (Addendum)
 You were seen today for STD screening.  Your pregnancy test was negative.  Your swab and blood work will be resulted tomorrow. You will see this on mychart and you will be notified if anything is positive for treatment.  Avoid intercourse until results are completed and treatment given if necessary.

## 2023-06-01 NOTE — ED Triage Notes (Signed)
 Regular scheduled STD testing as well as pregnancy test- Entered by patient  Pt states no symptoms to report.

## 2023-06-01 NOTE — ED Provider Notes (Signed)
 [facility]    CSN: [id number] Arrival date & time: 06/01/23  1146      History   Chief Complaint Chief Complaint  Patient presents with   SEXUALLY TRANSMITTED DISEASE    Regular scheduled testing - Entered by patient    HPI Charlotte Acosta is a 23 y.o. female.   Patient is here for std screening and pregnancy test.  She denies any symptoms or recent exposures.  She would like blood work as well.    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  OB History   No obstetric history on file.      Home Medications    Prior to Admission medications   Medication Sig Start Date End Date Taking? Authorizing Provider  etonogestrel (NEXPLANON) 68 MG IMPL implant  05/11/20  Yes [provider]  Galcanezumab-gnlm 120 MG/ML SOSY Inject into the skin. 05/09/23  Yes [provider]  rizatriptan (MAXALT) 5 MG tablet Take 5 mg by mouth daily as needed. 03/22/23  Yes [provider]  topiramate (TOPAMAX) 25 MG tablet Take by mouth. 03/22/23  Yes [provider]  albuterol (PROVENTIL HFA;VENTOLIN HFA) 108 (90 BASE) MCG/ACT inhaler Inhale 2 puffs into the lungs every 6 (six) hours as needed. For wheezing/shortness of breath    [provider]  cetirizine (ZYRTEC) 10 MG tablet Take 1 tablet (10 mg total) by mouth daily. 10/11/22   Bing Neighbors, NP  diphenhydrAMINE (BENADRYL)  25 MG tablet Take 25 mg by mouth at bedtime as needed for itching. 07/14/20   [provider]  diphenhydrAMINE (BENADRYL) 50 MG tablet Take 50 mg by mouth at bedtime as needed for itching.    [provider]  docusate sodium (COLACE) 50 MG capsule Take by mouth. 12/08/21   [provider]  Ergocalciferol (VITAMIN D2 PO) Take by mouth. 11/03/21   [provider]  famotidine (PEPCID) 20 MG tablet Take 1 tablet (20 mg total) by mouth at bedtime. 12/06/22   Gustavus Bryant, FNP  metroNIDAZOLE (FLAGYL) 500 MG tablet Take 1 tablet (500 mg total) by mouth 2 (two) times daily. Patient not taking: Reported on 12/19/2022 09/21/22   Merrilee Jansky, MD  omeprazole (PRILOSEC) 20 MG capsule Take 20 mg by mouth daily. 12/08/21   [provider]  sucralfate (CARAFATE) 1 g tablet Take 1 tablet (1 g total) by mouth 2 (two) times daily. 12/06/22   Gustavus Bryant, FNP    Family History Family History  Problem Relation Age of Onset   Healthy Mother    Healthy Father     Social History Social History   Tobacco Use   Smoking status: Never   Smokeless tobacco: Never  Vaping Use   Vaping status: Never Used  Substance Use Topics   Alcohol use: Never   Drug use: Never     Allergies   Rizatriptan   Review of Systems Review  of Systems  Constitutional: Negative.   HENT: Negative.    Respiratory: Negative.    Cardiovascular: Negative.   Gastrointestinal: Negative.   Genitourinary: Negative.   Musculoskeletal: Negative.   Psychiatric/Behavioral: Negative.       Physical Exam Triage Vital Signs ED Triage Vitals  Encounter Vitals Group     BP 06/01/23 1213 (!) 147/84     Systolic BP Percentile --      Diastolic BP Percentile --      Pulse Rate 06/01/23 1213 73     Resp 06/01/23 1213 18     Temp 06/01/23 1213 98.7 F (37.1 C)     Temp Source 06/01/23 1213 Oral     SpO2 06/01/23 1213 99 %     Weight --      Height --      Head Circumference --       Peak Flow --      Pain Score 06/01/23 1212 0     Pain Loc --      Pain Education --      Exclude from Growth Chart --    No data found.  Updated Vital Signs BP (!) 147/84 (BP Location: Left Arm)   Pulse 73   Temp 98.7 F (37.1 C) (Oral)   Resp 18   LMP 04/28/2023   SpO2 99%   Visual Acuity Right Eye Distance:   Left Eye Distance:   Bilateral Distance:    Right Eye Near:   Left Eye Near:    Bilateral Near:     Physical Exam Constitutional:      Appearance: Normal appearance. She is normal weight. She is not ill-appearing or toxic-appearing.  Cardiovascular:     Rate and Rhythm: Normal rate and regular rhythm.  Pulmonary:     Effort: Pulmonary effort is normal.     Breath sounds: Normal breath sounds.  Neurological:     General: No focal deficit present.     Mental Status: She is alert.  Psychiatric:        Mood and Affect: Mood normal.     UC Treatments / Results  Labs (all labs ordered are listed, but only abnormal results are displayed) Labs Reviewed  POCT URINE PREGNANCY - Normal  RPR  HIV ANTIBODY (ROUTINE TESTING W REFLEX)  CERVICOVAGINAL ANCILLARY ONLY   UPT negative  EKG   Radiology No results found.  Procedures Procedures (including critical care time)  Medications Ordered in UC Medications - No data to display  Initial Impression / Assessment and Plan / UC Course  I have reviewed the triage vital signs and the nursing notes.  Pertinent labs & imaging results that were available during my care of the patient were reviewed by me and considered in my medical decision making (see chart for details).   Final Clinical Impressions(s) / UC Diagnoses   Final diagnoses:  Screening for STD (sexually transmitted disease)     Discharge Instructions      You were seen today for STD screening.  Your pregnancy test was negative.  Your swab and blood work will be resulted tomorrow. You will see this on mychart and you will be notified if  anything is positive for treatment.  Avoid intercourse until results are completed and treatment given if necessary.      ED Prescriptions   None    PDMP not reviewed this encounter.   Jannifer Franklin, MD 06/01/23 1228

## 2023-06-02 LAB — HIV ANTIBODY (ROUTINE TESTING W REFLEX): HIV Screen 4th Generation wRfx: NONREACTIVE

## 2023-06-02 LAB — RPR: RPR Ser Ql: NONREACTIVE

## 2023-06-04 LAB — CERVICOVAGINAL ANCILLARY ONLY
Bacterial Vaginitis (gardnerella): NEGATIVE
Candida Glabrata: NEGATIVE
Candida Vaginitis: NEGATIVE
Chlamydia: NEGATIVE
Comment: NEGATIVE
Comment: NEGATIVE
Comment: NEGATIVE
Comment: NEGATIVE
Comment: NEGATIVE
Comment: NORMAL
Neisseria Gonorrhea: NEGATIVE
Trichomonas: NEGATIVE

## 2023-09-04 ENCOUNTER — Other Ambulatory Visit: Payer: Self-pay

## 2023-09-04 ENCOUNTER — Encounter: Payer: Self-pay | Admitting: Emergency Medicine

## 2023-09-04 ENCOUNTER — Ambulatory Visit
Admission: EM | Admit: 2023-09-04 | Discharge: 2023-09-04 | Disposition: A | Discharge status: Home / Self Care | Attending: Emergency Medicine | Admitting: Emergency Medicine

## 2023-09-04 DIAGNOSIS — M25511 Pain in right shoulder: Secondary | ICD-10-CM | POA: Diagnosis not present

## 2023-09-04 MED ORDER — CYCLOBENZAPRINE HCL 10 MG PO TABS: 10.0000 mg | ORAL_TABLET | Freq: Every day | ORAL | 0 refills | Status: DC

## 2023-09-04 MED ORDER — KETOROLAC TROMETHAMINE 15 MG/ML IJ SOLN
30.0000 mg | Freq: Once | INTRAMUSCULAR | Status: AC
  Administered 2023-09-04: 30 mg via INTRAVENOUS

## 2023-09-04 MED ORDER — PREDNISONE 10 MG (21) PO TBPK: ORAL_TABLET | Freq: Every day | ORAL | 0 refills | Status: DC

## 2023-09-04 NOTE — ED Provider Notes (Signed)
 [facility]    CSN: [id number] Arrival date & time: 09/04/23  1632      History   Chief Complaint Chief Complaint  Patient presents with   Shoulder Pain    HPI Charlotte Acosta is a 23 y.o. female.   Patient presents for evaluation of right-sided shoulder pain radiating down the right arm beginning 7 days ago.  Pain has been constant, associated tingling endorses movement of the 2nd and 4th finger when pain is heightened.  Has full range of motion.  Denies injury or trauma, endorses symptoms started after lifting a package of water while at work.  has not attempted treatment.   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 No pertinent surgical history.  OB History   No obstetric history on file.      Home Medications    Prior to Admission medications   Medication Sig Start Date End Date Taking? Authorizing Provider  albuterol (PROVENTIL HFA;VENTOLIN HFA) 108 (90 BASE) MCG/ACT inhaler Inhale 2 puffs into the lungs every 6 (six) hours as needed. For wheezing/shortness of breath    [provider]  cetirizine  (ZYRTEC ) 10 MG tablet Take 1 tablet (10 mg total) by mouth daily. 10/11/22   Arloa Suzen RAMAN, NP  diphenhydrAMINE (BENADRYL) 25 MG tablet Take 25 mg by mouth at bedtime as needed for itching. 07/14/20   [provider]  diphenhydrAMINE (BENADRYL) 50 MG tablet Take 50 mg by mouth at bedtime as needed  for itching.    [provider]  docusate sodium (COLACE) 50 MG capsule Take by mouth. 12/08/21   [provider]  Ergocalciferol (VITAMIN D2 PO) Take by mouth. 11/03/21   [provider]  etonogestrel (NEXPLANON) 68 MG IMPL implant  05/11/20   [provider]  famotidine  (PEPCID ) 20 MG tablet Take 1 tablet (20 mg total) by mouth at bedtime. 12/06/22   Hazen Darryle BRAVO, FNP  Galcanezumab-gnlm 120 MG/ML SOSY Inject into the skin. 05/09/23   [provider]  metroNIDAZOLE  (FLAGYL ) 500 MG tablet Take 1 tablet (500 mg total) by mouth 2 (two) times daily. Patient not taking: Reported on 12/19/2022 09/21/22   Blaise Aleene KIDD, MD  omeprazole (PRILOSEC) 20 MG capsule Take 20 mg by mouth daily. 12/08/21   [provider]  rizatriptan (MAXALT) 5 MG tablet Take 5 mg by mouth daily as needed. 03/22/23   [provider]  sucralfate  (CARAFATE ) 1 g tablet Take 1 tablet (1 g total) by mouth 2 (two) times daily. 12/06/22   Hazen Darryle BRAVO, FNP  topiramate (TOPAMAX) 25 MG tablet Take by mouth. 03/22/23   [provider]    Family History Family History  Problem Relation Age of Onset   Healthy Mother    Healthy Father     Social History Social History   Tobacco Use   Smoking status: Never   Smokeless tobacco: Never  Vaping Use   Vaping status: Never Used  Substance Use Topics   Alcohol use: Never   Drug use: Never     Allergies   Rizatriptan   Review of Systems Review of Systems   Physical Exam Triage Vital Signs ED Triage Vitals  Encounter Vitals Group     BP 09/04/23 1714 119/75     Girls Systolic BP Percentile --      Girls Diastolic BP Percentile --      Boys Systolic BP Percentile --      Boys Diastolic BP Percentile --      Pulse Rate 09/04/23 1714 73     Resp 09/04/23 1714 18     Temp 09/04/23 1714 98.6 F (37 C)     Temp Source 09/04/23 1714 Oral     SpO2 09/04/23 1714 99 %     Weight --      Height --       Head Circumference --      Peak Flow --      Pain Score 09/04/23 1715 5     Pain Loc --      Pain Education --      Exclude from Growth Chart --    No data found.  Updated Vital Signs BP 119/75 (BP Location: Left Arm)   Pulse 73   Temp 98.6 F (37 C) (Oral)   Resp 18   SpO2 99%   Visual Acuity Right Eye Distance:   Left Eye Distance:   Bilateral Distance:    Right Eye Near:   Left Eye Near:    Bilateral Near:     Physical Exam Constitutional:      Appearance: Normal appearance.  Eyes:     Extraocular Movements: Extraocular movements intact.  Pulmonary:     Effort: Pulmonary effort is normal.  Musculoskeletal:     Comments: Tenderness present over the superior the right shoulder extending into the trapezius muscle, no ecchymosis swelling or deformity, no tenderness over the [employer] joint, able to complete full range of motion, 2+ brachial pulse, strength of 5 out of 5  Neurological:     Mental Status: She is alert and oriented to person, place, and time. Mental status is at baseline.      UC Treatments / Results  Labs (all labs ordered are listed, but only abnormal results are displayed) Labs Reviewed - No data to display  EKG   Radiology No results found.  Procedures Procedures (including critical care time)  Medications Ordered in UC Medications - No data to display  Initial Impression / Assessment and Plan / UC Course  I have reviewed the triage vital signs and the nursing notes.  Pertinent labs & imaging results that were available during my care of the patient were reviewed by me and considered in my medical decision making (see chart for details).   Acute right shoulder pain  Low suspicion for fracture therefore imaging deferred, discussed this with patient, will move forward with treatment of muscular pain, Toradol  IM given and prescribed prednisone  and Flexeril  for home use recommended supportive care through [facility], heat massage stretching with  activity as tolerated, walking referral given orthopedic Final Clinical Impressions(s) / UC Diagnoses   Final diagnoses:  None   Discharge Instructions   None    ED Prescriptions   None    PDMP not reviewed this encounter.   Teresa Shelba SAUNDERS, [place] 09/04/23 [phone]

## 2023-09-04 NOTE — ED Triage Notes (Signed)
 Pt here for right shoulder pain after picking up waters at work today; pt sts heard pop and now has pain down arm

## 2023-09-04 NOTE — Discharge Instructions (Signed)
 Your pain is most likely caused by irritation to the muscles.  You have been given an injection of Toradol  to help reduce with pain, daily will start to see improvement within an hour  Starting tomorrow day prednisone  every morning with food as directed to continue the process above, avoid use of ibuprofen  but may take Tylenol or any topical medicines  May use muscle relaxant at bedtime as needed for additional comfort, be mindful to make you drowsy  You may use heating pad in 15 minute intervals as needed for additional comfort  Begin stretching affected area daily for 10 minutes as tolerated to further loosen muscles   When lying down place pillow underneath ears and behind neck  Can try sleeping without pillow on firm mattress this keeps the neck in alignment  Practice good posture: head back, shoulders back, chest forward, pelvis back and weight distributed evenly on both legs  If pain persist after recommended treatment or reoccurs if may be beneficial to follow up with orthopedic specialist for evaluation, this doctor specializes in the bones and can manage your symptoms long-term with options such as but not limited to imaging, medications or physical therapy

## 2023-12-01 ENCOUNTER — Ambulatory Visit
Admission: RE | Admit: 2023-12-01 | Discharge: 2023-12-01 | Disposition: A | Discharge status: Home / Self Care | Source: Ambulatory Visit | Attending: Physician Assistant | Admitting: Physician Assistant

## 2023-12-01 VITALS — BP 119/89 | HR 66 | Temp 98.2°F | Resp 20

## 2023-12-01 DIAGNOSIS — M25511 Pain in right shoulder: Secondary | ICD-10-CM | POA: Diagnosis not present

## 2023-12-01 MED ORDER — CYCLOBENZAPRINE HCL 10 MG PO TABS: 10.0000 mg | ORAL_TABLET | Freq: Two times a day (BID) | ORAL | 0 refills | Status: AC | PRN

## 2023-12-01 NOTE — Discharge Instructions (Addendum)
 You were seen today for concerns of right sided shoulder pain.  Based on your symptoms and exam I am concerned that you may have something called impingement syndrome or you may be developing something called adhesive capsulitis (also known as frozen shoulder). I recommend that you follow-up with orthopedics for further evaluation and ongoing management of this as you may need a referral to physical therapy as well.  To assist with your symptoms I recommend alternating Tylenol and ibuprofen , completing gentle stretches and massage as tolerated.  I have sent in a medication called cyclobenzaprine , Flexeril  to help with muscle pain.  Please be advised that this is a muscle relaxer and it can cause drowsiness and sedation so do not take it if you need to remain alert to drive. You can also try applying warm compresses to the area in addition to doing the stretches that I have included in your paperwork.

## 2023-12-01 NOTE — ED Triage Notes (Addendum)
 Patient reports experiencing a shooting pain in the right shoulder that radiated down to the right hand while working a few months ago. She states the pain initially resolved but returned this past Tuesday. 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  Hoe interventions: none

## 2023-12-01 NOTE — ED Provider Notes (Addendum)
 [facility]    CSN: [id number] Arrival date & time: 12/01/23  1022      History   Chief Complaint Chief Complaint  Patient presents with   Arm Injury    Continuous shoulder pain - Entered by patient    HPI Charlotte Acosta is a 23 y.o. female.  has a past medical history of Asthma.   HPI  Pt presents today with concerns for right shoulder pain. She states this pain is similar to what she had several months ago which she thought had resolved after she was seen in UC and given Toradol  injection and muscle relaxer She states the pain has returned and it seems worse than previously She reports there is radiation from the shoulder to her hand and her ring and middle fingers will go numb Aggravating: she states it only seems to happen when she is at work and lifting heavy things Alleviating: she reports it seems to resolve on it's own Interventions; none  She reports when the pain starts it usually lasts about an hour with the shooting sensation down the arm She denies swelling, bruising, tremors She denies known injuries or trauma to the area    Past Medical History:  Diagnosis Date   Asthma     Patient Active Problem List   Diagnosis Date Noted   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 No pertinent surgical history.  OB History   No obstetric history on file.      Home Medications    Prior to Admission medications   Medication Sig Start Date End Date Taking? Authorizing Provider  cyclobenzaprine  (FLEXERIL ) 10 MG  tablet Take 1 tablet (10 mg total) by mouth 2 (two) times daily as needed for muscle spasms. 12/01/23  Yes Trishia Cuthrell E, PA-C  albuterol (PROVENTIL HFA;VENTOLIN HFA) 108 (90 BASE) MCG/ACT inhaler Inhale 2 puffs into the lungs every 6 (six) hours as needed. For wheezing/shortness of breath    [provider]  cetirizine  (ZYRTEC ) 10 MG tablet Take 1 tablet (10 mg total) by mouth daily. 10/11/22   Arloa Suzen RAMAN, NP  diphenhydrAMINE (BENADRYL) 25 MG tablet Take 25 mg by mouth at bedtime as needed for itching. 07/14/20   [provider]  diphenhydrAMINE (BENADRYL) 50 MG tablet Take 50 mg by mouth at bedtime as needed for itching.    [provider]  Ergocalciferol (VITAMIN D2 PO) Take by mouth. 11/03/21   [provider]  etonogestrel (NEXPLANON) 68 MG IMPL implant  05/11/20   [provider]  Galcanezumab-gnlm 120 MG/ML SOSY Inject into the skin. 05/09/23   [provider]  metroNIDAZOLE  (FLAGYL ) 500 MG tablet Take 1 tablet (500 mg total) by mouth 2 (two) times daily. Patient not taking: Reported on 12/19/2022 09/21/22   Blaise Aleene KIDD, MD  rizatriptan (MAXALT) 5 MG tablet Take 5 mg by mouth daily as needed. 03/22/23   [provider]  topiramate (TOPAMAX) 25 MG tablet Take by mouth. 03/22/23   [provider]    Family History Family History  Problem Relation Age of Onset  Healthy Mother    Healthy Father     Social History Social History   Tobacco Use   Smoking status: Never   Smokeless tobacco: Never  Vaping Use   Vaping status: Never Used  Substance Use Topics   Alcohol use: Never   Drug use: Never     Allergies   Rizatriptan   Review of Systems Review of Systems  Musculoskeletal:  Positive for arthralgias. Negative for joint swelling.  Neurological:  Positive for numbness (down right arm). Negative for tremors and weakness.     Physical Exam Triage Vital Signs ED Triage Vitals  Encounter Vitals  Group     BP 12/01/23 1036 119/89     Girls Systolic BP Percentile --      Girls Diastolic BP Percentile --      Boys Systolic BP Percentile --      Boys Diastolic BP Percentile --      Pulse Rate 12/01/23 1036 66     Resp 12/01/23 1036 20     Temp 12/01/23 1036 98.2 F (36.8 C)     Temp Source 12/01/23 1036 Oral     SpO2 12/01/23 1036 100 %     Weight --      Height --      Head Circumference --      Peak Flow --      Pain Score 12/01/23 1034 7     Pain Loc --      Pain Education --      Exclude from Growth Chart --    No data found.  Updated Vital Signs BP 119/89 (BP Location: Left Arm)   Pulse 66   Temp 98.2 F (36.8 C) (Oral)   Resp 20   SpO2 100%   Visual Acuity Right Eye Distance:   Left Eye Distance:   Bilateral Distance:    Right Eye Near:   Left Eye Near:    Bilateral Near:     Physical Exam Vitals reviewed.  Constitutional:      General: She is awake.     Appearance: Normal appearance. She is well-developed and well-groomed.  HENT:     Head: Normocephalic and atraumatic.  Eyes:     General: Lids are normal. Gaze aligned appropriately.     Extraocular Movements: Extraocular movements intact.     Conjunctiva/sclera: Conjunctivae normal.  Pulmonary:     Effort: Pulmonary effort is normal.  Musculoskeletal:     Right shoulder: Tenderness present. No swelling or deformity. Decreased range of motion.     Right wrist: Normal pulse.     Right hand: Normal range of motion. Normal strength. Normal pulse.     Comments: Pt has reduced ROM of the right shoulder with regards to flexion, adduction, Abduction. Negative empty can testing bilaterally.  She has reduced internal and external rotation on the right side as well She has tenderness to palpation of the anterior aspect of the shoulder and in supraclavicular space    Neurological:     Mental Status: She is alert and oriented to person, place, and time.  Psychiatric:        Attention and Perception:  Attention and perception normal.        Mood and Affect: Mood and affect normal.        Speech: Speech normal.        Behavior: Behavior normal. Behavior is cooperative.      UC Treatments / Results  Labs (all labs ordered are listed,  but only abnormal results are displayed) Labs Reviewed - No data to display  EKG   Radiology No results found.  Procedures Procedures (including critical care time)  Medications Ordered in UC Medications - No data to display  Initial Impression / Assessment and Plan / UC Course  I have reviewed the triage vital signs and the nursing notes.  Pertinent labs & imaging results that were available during my care of the patient were reviewed by me and considered in my medical decision making (see chart for details).      Final Clinical Impressions(s) / UC Diagnoses   Final diagnoses:  Right shoulder pain, unspecified chronicity   Patient presents today for concerns of recurrent, right shoulder pain.  She was previously seen at urgent care in July for same complaint which seemingly resolved with Toradol  shot, prednisone  taper and Flexeril .  She states that the pain is returned and seems worse now.  She has decreased range of motion due to pain and reports shooting pain traveling from the shoulder to several fingers.  At this time I suspect a potential impingement syndrome or adhesive capsulitis.  I am also suspicious for potential nerve involvement given radiculopathy symptoms.  Without reported trauma or injuries I am less suspicious for osseous injury and think xray can be foregone for now. Given recurrence and persistent of symptoms I recommend follow-up with orthopedics for further evaluation and ongoing management.  Patient is amenable to this and was provided with home care stretches as well as addresses and contact information for local orthopedic offices.  Follow-up as needed for progressing or persistent symptoms    Discharge Instructions       You were seen today for concerns of right sided shoulder pain.  Based on your symptoms and exam I am concerned that you may have something called impingement syndrome or you may be developing something called adhesive capsulitis (also known as frozen shoulder). I recommend that you follow-up with orthopedics for further evaluation and ongoing management of this as you may need a referral to physical therapy as well.  To assist with your symptoms I recommend alternating Tylenol and ibuprofen , completing gentle stretches and massage as tolerated.  I have sent in a medication called cyclobenzaprine , Flexeril  to help with muscle pain.  Please be advised that this is a muscle relaxer and it can cause drowsiness and sedation so do not take it if you need to remain alert to drive. You can also try applying warm compresses to the area in addition to doing the stretches that I have included in your paperwork.      ED Prescriptions     Medication Sig Dispense Auth. Provider   cyclobenzaprine  (FLEXERIL ) 10 MG tablet Take 1 tablet (10 mg total) by mouth 2 (two) times daily as needed for muscle spasms. 20 tablet Rohnan Bartleson E, PA-C      PDMP not reviewed this encounter.   Cayman Brogden, [place] BRAVO, PA-C 12/01/23 1149    Terese Heier E, PA-C 12/01/23 1150

## 2023-12-13 ENCOUNTER — Ambulatory Visit (INDEPENDENT_AMBULATORY_CARE_PROVIDER_SITE_OTHER)

## 2023-12-13 ENCOUNTER — Ambulatory Visit
Admission: EM | Admit: 2023-12-13 | Discharge: 2023-12-13 | Disposition: A | Discharge status: Home / Self Care | Attending: Family Medicine | Admitting: Family Medicine

## 2023-12-13 DIAGNOSIS — M25511 Pain in right shoulder: Secondary | ICD-10-CM

## 2023-12-13 MED ORDER — KETOROLAC TROMETHAMINE 10 MG PO TABS: 10.0000 mg | ORAL_TABLET | Freq: Four times a day (QID) | ORAL | 0 refills | Status: AC | PRN

## 2023-12-13 MED ORDER — KETOROLAC TROMETHAMINE 30 MG/ML IJ SOLN
30.0000 mg | Freq: Once | INTRAMUSCULAR | Status: AC
  Administered 2023-12-13: 30 mg via INTRAMUSCULAR

## 2023-12-13 NOTE — ED Triage Notes (Signed)
 Patient reports visiting urgent care for a follow-up regarding shoulder pain. They went to Urgent Care at [facility] for this issue (most recently), but now the pain has worsened, becoming stabbing and constant, and generally intensifies with movement. This pain is recurrent. The patient was previously seen at [facility] on 07-08 for the same issue and was referred to Orthopaedics, with a possible upcoming appointment.

## 2023-12-13 NOTE — ED Provider Notes (Signed)
 [facility]    CSN: [id number] Arrival date & time: 12/13/23  1503      History   Chief Complaint Chief Complaint  Patient presents with   Shoulder Pain    HPI Charlotte Acosta is a 23 y.o. female.    Shoulder Pain  Here for continued right shoulder pain.  She was initially seen here in July for right shoulder pain that was more posterior.  An injection of Toradol , disown taper, and tizanidine relieves the pain and it resolved for a time.  Then she was seen again in early October at another of our urgent care sites for return of the shoulder pain.  Now it is in more the shoulder joint.  It is not radiating.  No trauma or fall.  No rash or fever  She is allergic to rizatriptan.  She thought she had made an appointment with orthopedics but we do not see 1 in epic.  She is pretty confident it was 1 that would show up in our system.  Is on her menstrual cycle currently.  She does have the Nexplanon. 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 No pertinent surgical history.  OB History   No obstetric history on file.      Home Medications    Prior to Admission medications   Medication Sig Start Date End Date Taking? Authorizing Provider  etonogestrel (NEXPLANON) 68 MG IMPL implant  05/11/20  Yes [provider]  ketorolac  (TORADOL ) 10 MG tablet Take 1 tablet (10 mg total) by mouth every 6 (six) hours as needed  (pain). 12/13/23  Yes Laniqua Torrens K, MD  metoprolol succinate (TOPROL-XL) 25 MG 24 hr tablet Take 25 mg by mouth daily. 09/21/23 09/20/24 Yes [provider]  albuterol (PROVENTIL HFA;VENTOLIN HFA) 108 (90 BASE) MCG/ACT inhaler Inhale 2 puffs into the lungs every 6 (six) hours as needed. For wheezing/shortness of breath    [provider]  cetirizine  (ZYRTEC ) 10 MG tablet Take 1 tablet (10 mg total) by mouth daily. 10/11/22   Arloa Suzen RAMAN, NP  cyclobenzaprine  (FLEXERIL ) 10 MG tablet Take 1 tablet (10 mg total) by mouth 2 (two) times daily as needed for muscle spasms. 12/01/23   Mecum, Erin E, PA-C  diphenhydrAMINE (BENADRYL) 25 MG tablet Take 25 mg by mouth at bedtime as needed for itching. 07/14/20   [provider]  diphenhydrAMINE (BENADRYL) 50 MG tablet Take 50 mg by mouth at bedtime as needed for itching.    [provider]  Ergocalciferol (VITAMIN D2 PO) Take by mouth. 11/03/21   [provider]  Galcanezumab-gnlm 120 MG/ML SOSY Inject into the skin. 05/09/23   [provider]  rizatriptan (MAXALT) 5 MG tablet Take 5 mg by mouth daily as needed. 03/22/23   [provider]  topiramate (TOPAMAX) 25 MG tablet Take by mouth. 03/22/23   [provider]    Family History Family History  Problem Relation Age of Onset  Healthy Mother    Healthy Father     Social History Social History   Tobacco Use   Smoking status: Never    Passive exposure: Past   Smokeless tobacco: Never  Vaping Use   Vaping status: Never Used  Substance Use Topics   Alcohol use: Yes    Comment: Occassionally.   Drug use: Never     Allergies   Rizatriptan   Review of Systems Review of Systems   Physical Exam Triage Vital Signs ED Triage Vitals  Encounter Vitals Group     BP 12/13/23 1521 131/81     Girls Systolic BP Percentile --      Girls Diastolic BP Percentile --      Boys Systolic BP Percentile --      Boys Diastolic BP  Percentile --      Pulse Rate 12/13/23 1521 74     Resp 12/13/23 1521 18     Temp 12/13/23 1521 98.3 F (36.8 C)     Temp Source 12/13/23 1521 Oral     SpO2 12/13/23 1521 99 %     Weight 12/13/23 1519 235 lb (106.6 kg)     Height 12/13/23 1519 5' 1 (1.549 m)     Head Circumference --      Peak Flow --      Pain Score 12/13/23 1515 9     Pain Loc --      Pain Education --      Exclude from Growth Chart --    No data found.  Updated Vital Signs BP 131/81 (BP Location: Left Arm)   Pulse 74   Temp 98.3 F (36.8 C) (Oral)   Resp 18   Ht 5' 1 (1.549 m)   Wt 106.6 kg   LMP  (LMP Unknown)   SpO2 99%   BMI 44.40 kg/m   Visual Acuity Right Eye Distance:   Left Eye Distance:   Bilateral Distance:    Right Eye Near:   Left Eye Near:    Bilateral Near:     Physical Exam Vitals reviewed.  Constitutional:      General: She is not in acute distress.    Appearance: She is not ill-appearing, toxic-appearing or diaphoretic.  Musculoskeletal:     Comments: Tenderness of right shoulder joint. ROM causes pain. Distal pulses normal.  Skin:    Coloration: Skin is not pale.  Neurological:     General: No focal deficit present.     Mental Status: She is alert and oriented to person, place, and time.  Psychiatric:        Behavior: Behavior normal.      UC Treatments / Results  Labs (all labs ordered are listed, but only abnormal results are displayed) Labs Reviewed - No data to display  EKG   Radiology DG Shoulder Right Result Date: 12/13/2023 CLINICAL DATA:  right shoulder pain x 2 weeks this time. EXAM: DG SHOULDER 2+V*R* COMPARISON:  None Available. FINDINGS: No acute fracture or dislocation. No aggressive osseous lesion. Glenohumeral and acromioclavicular joints are normal in alignment. No significant arthritis. No soft tissue swelling. No radiopaque foreign bodies. IMPRESSION: No acute osseous abnormality of the right shoulder. Electronically Signed   By: Ree Molt M.D.   On: 12/13/2023 16:09    Procedures Procedures (including critical care time)  Medications Ordered in UC Medications  ketorolac  (TORADOL ) 30 MG/ML injection 30 mg (has no administration in time range)    Initial Impression / Assessment and  Plan / UC Course  I have reviewed the triage vital signs and the nursing notes.  Pertinent labs & imaging results that were available during my care of the patient were reviewed by me and considered in my medical decision making (see chart for details).      No bony abnormality on the shoulder x-ray.  Toradol  injection is given here and Toradol  tablets are sent to the pharmacy.  She still has some muscle relaxers and might use them at bedtime if needed.  She is given contact information for orthopedics so that she can make an appointment with them.  Also work note is provided Final Clinical Impressions(s) / UC Diagnoses   Final diagnoses:  Acute pain of right shoulder     Discharge Instructions      Your x-ray does not show any bony abnormality of the shoulder.  Most likely this is some inflammation in the soft tissues.  You have been given a shot of Toradol  30 mg today.  Ketorolac  10 mg tablets--take 1 tablet every 6 hours as needed for pain.  This is the same medicine that is in the shot we just gave you       ED Prescriptions     Medication Sig Dispense Auth. Provider   ketorolac  (TORADOL ) 10 MG tablet Take 1 tablet (10 mg total) by mouth every 6 (six) hours as needed (pain). 20 tablet Johndavid Geralds K, MD      PDMP not reviewed this encounter.   Vonna Sharlet POUR, MD 12/13/23 [phone]

## 2023-12-13 NOTE — Discharge Instructions (Signed)
 Your x-ray does not show any bony abnormality of the shoulder.  Most likely this is some inflammation in the soft tissues.  You have been given a shot of Toradol  30 mg today.  Ketorolac  10 mg tablets--take 1 tablet every 6 hours as needed for pain.  This is the same medicine that is in the shot we just gave you
